# Patient Record
Sex: Male | Born: 1965 | Race: Black or African American | Hispanic: No | Marital: Single | Smoking: Current every day smoker
Health system: Southern US, Community
[De-identification: ages and names within clinical notes are randomized; demographics above are authoritative.]

## PROBLEM LIST (undated history)

## (undated) DIAGNOSIS — I10 Essential (primary) hypertension: Secondary | ICD-10-CM

## (undated) DIAGNOSIS — E119 Type 2 diabetes mellitus without complications: Secondary | ICD-10-CM

---

## 2020-01-08 ENCOUNTER — Other Ambulatory Visit: Payer: Self-pay

## 2020-01-08 ENCOUNTER — Emergency Department: Payer: Self-pay

## 2020-01-08 ENCOUNTER — Emergency Department
Admission: EM | Admit: 2020-01-08 | Discharge: 2020-01-08 | Disposition: A | Discharge status: Home / Self Care | Payer: Self-pay | Attending: Emergency Medicine | Admitting: Emergency Medicine

## 2020-01-08 ENCOUNTER — Encounter: Payer: Self-pay | Admitting: Radiology

## 2020-01-08 DIAGNOSIS — Z20822 Contact with and (suspected) exposure to covid-19: Secondary | ICD-10-CM | POA: Insufficient documentation

## 2020-01-08 DIAGNOSIS — E1165 Type 2 diabetes mellitus with hyperglycemia: Secondary | ICD-10-CM | POA: Insufficient documentation

## 2020-01-08 DIAGNOSIS — L0231 Cutaneous abscess of buttock: Secondary | ICD-10-CM

## 2020-01-08 LAB — CBC WITH DIFFERENTIAL/PLATELET
Abs Immature Granulocytes: 0.02 10*3/uL (ref 0.00–0.07)
Basophils Absolute: 0 10*3/uL (ref 0.0–0.1)
Basophils Relative: 0 %
Eosinophils Absolute: 0.1 10*3/uL (ref 0.0–0.5)
Eosinophils Relative: 1 %
HCT: 35.8 % — ABNORMAL LOW (ref 39.0–52.0)
Hemoglobin: 12.5 g/dL — ABNORMAL LOW (ref 13.0–17.0)
Immature Granulocytes: 0 %
Lymphocytes Relative: 25 %
Lymphs Abs: 1.7 10*3/uL (ref 0.7–4.0)
MCH: 32.1 pg (ref 26.0–34.0)
MCHC: 34.9 g/dL (ref 30.0–36.0)
MCV: 92 fL (ref 80.0–100.0)
Monocytes Absolute: 0.4 10*3/uL (ref 0.1–1.0)
Monocytes Relative: 6 %
Neutro Abs: 4.5 10*3/uL (ref 1.7–7.7)
Neutrophils Relative %: 68 %
Platelets: 223 10*3/uL (ref 150–400)
RBC: 3.89 MIL/uL — ABNORMAL LOW (ref 4.22–5.81)
RDW: 12.2 % (ref 11.5–15.5)
WBC: 6.7 10*3/uL (ref 4.0–10.5)
nRBC: 0 % (ref 0.0–0.2)

## 2020-01-08 LAB — BASIC METABOLIC PANEL
Anion gap: 12 (ref 5–15)
BUN: 11 mg/dL (ref 6–20)
CO2: 24 mmol/L (ref 22–32)
Calcium: 8.1 mg/dL — ABNORMAL LOW (ref 8.9–10.3)
Chloride: 96 mmol/L — ABNORMAL LOW (ref 98–111)
Creatinine, Ser: 1.06 mg/dL (ref 0.61–1.24)
GFR calc Af Amer: >60 mL/min (ref >60)
GFR calc non Af Amer: >60 mL/min (ref >60)
Glucose, Bld: 479 mg/dL — ABNORMAL HIGH (ref 70–99)
Potassium: 3.9 mmol/L (ref 3.5–5.1)
Sodium: 132 mmol/L — ABNORMAL LOW (ref 135–145)

## 2020-01-08 LAB — SARS CORONAVIRUS 2 BY RT PCR (HOSPITAL ORDER, PERFORMED IN ~~LOC~~ HOSPITAL LAB): SARS Coronavirus 2: NEGATIVE

## 2020-01-08 LAB — LACTIC ACID, PLASMA: Lactic Acid, Venous: 1 mmol/L (ref 0.5–1.9)

## 2020-01-08 LAB — GLUCOSE, CAPILLARY: Glucose-Capillary: 257 mg/dL — ABNORMAL HIGH (ref 70–99)

## 2020-01-08 IMAGING — CT CT ABD-PELV W/ CM
2 of 5 series · 15 of 46 positions shown, 17 images · IV contrast (APPLIED)
Comparison: No priors.

CLINICAL DATA: 53-year-old male with history of palpable
abnormality in the inner aspect of the left buttock region near the
anus concerning for perirectal abscess.

EXAM:
CT ABDOMEN AND PELVIS WITH CONTRAST
TECHNIQUE: Multidetector CT imaging of the abdomen and pelvis was performed
using the standard protocol following bolus administration of
intravenous contrast.
CONTRAST:  125mL OMNIPAQUE IOHEXOL 300 MG/ML  SOLN

[Series 2: routine abd/pel with · axial · 0.93mm/px · z∈[-560,-55]mm · 12 of 113 slices shown, 14 images]
[im 6/113  soft-tissue]
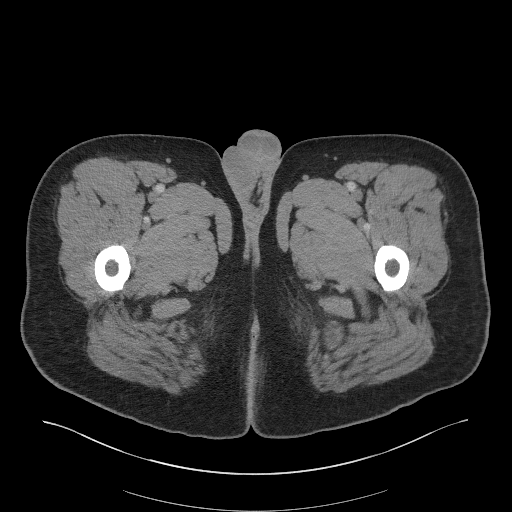
[im 6/113  bone]
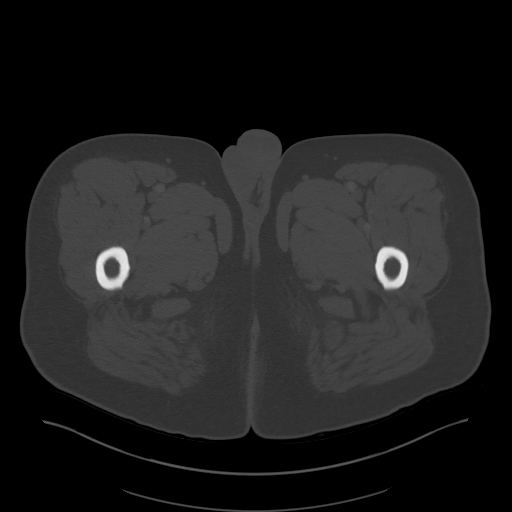
[im 18/113  soft-tissue]
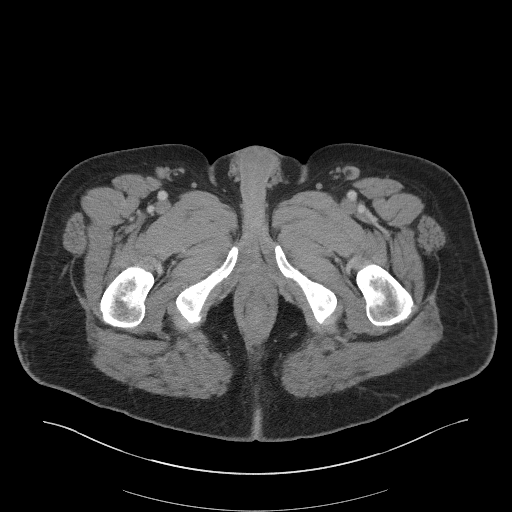
[im 24/113  soft-tissue]
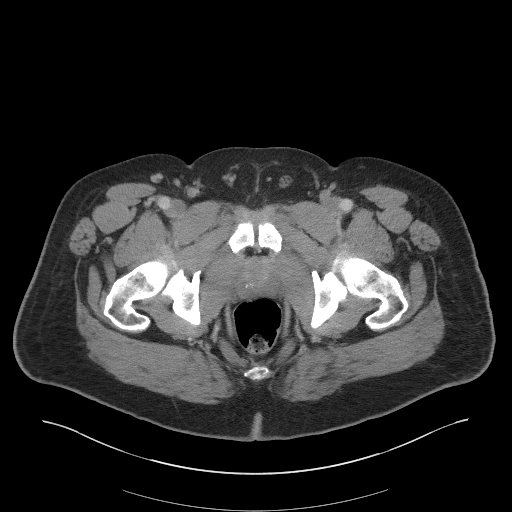
[im 36/113  soft-tissue]
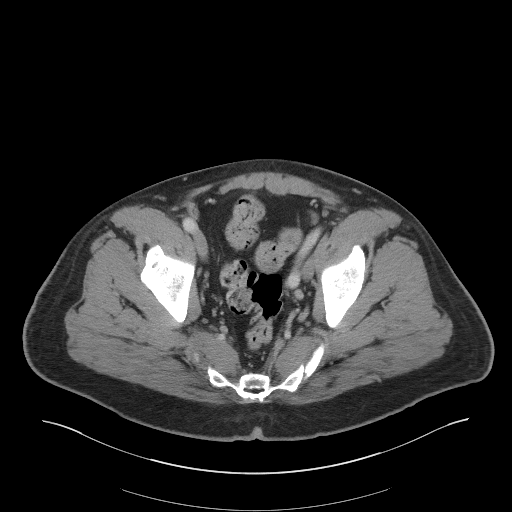
[im 42/113  soft-tissue]
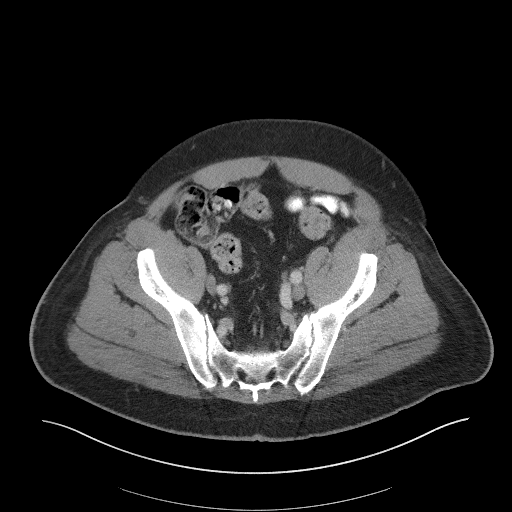
[im 54/113  soft-tissue]
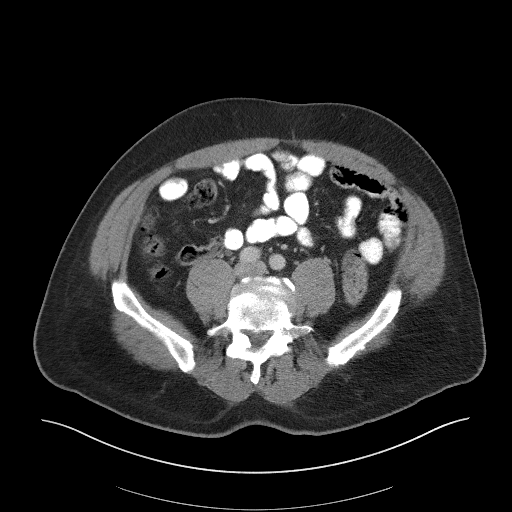
[im 59/113  soft-tissue]
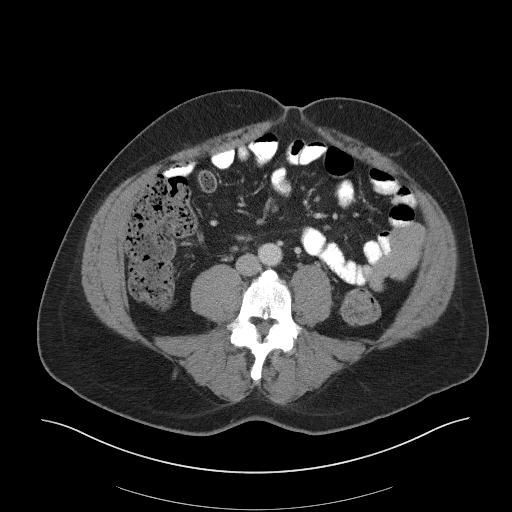
[im 71/113  soft-tissue]
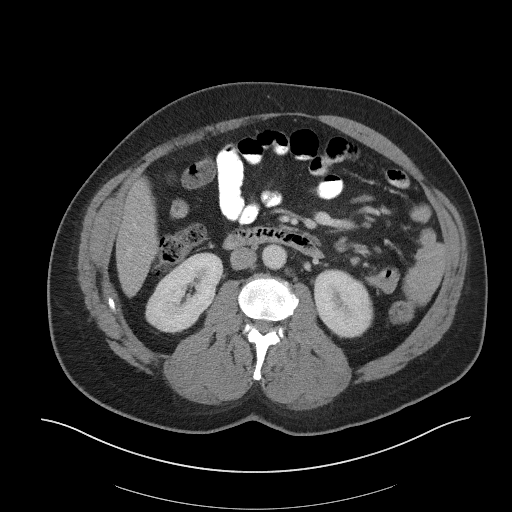
[im 77/113  soft-tissue]
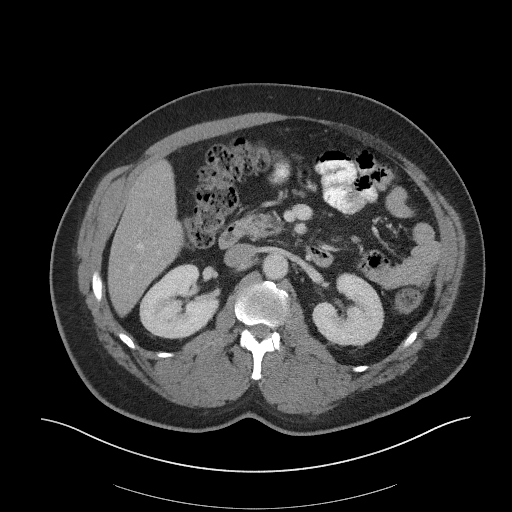
[im 77/113  bone]
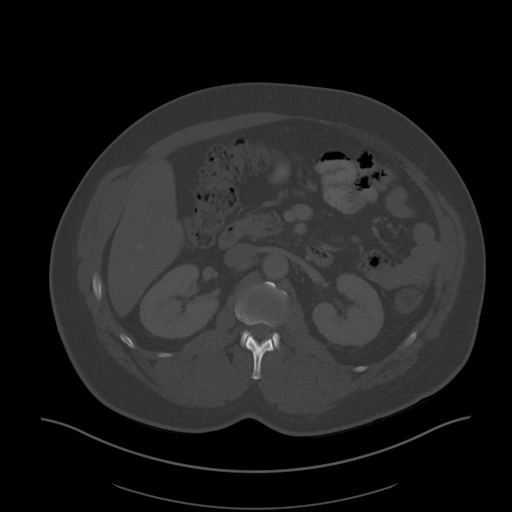
[im 89/113  soft-tissue]
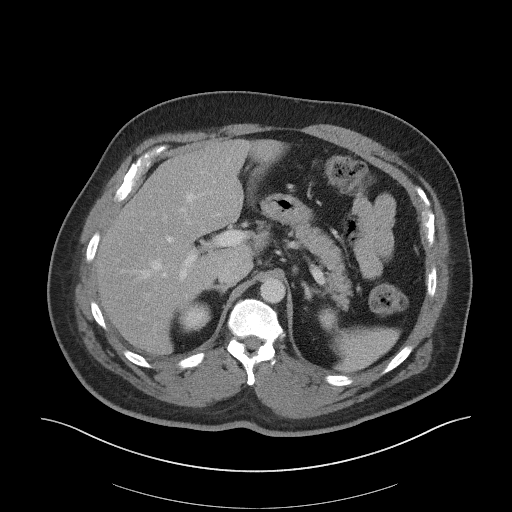
[im 95/113  soft-tissue]
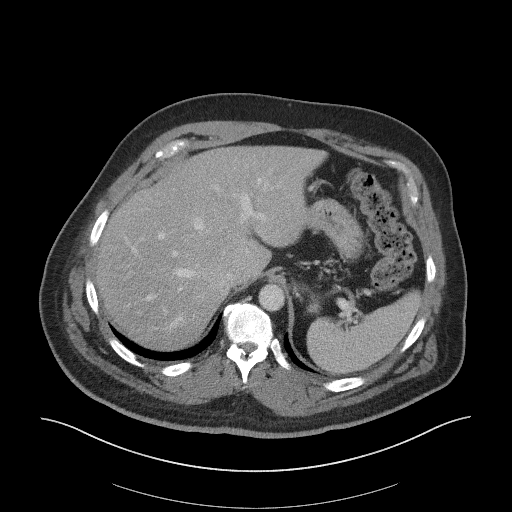
[im 107/113  soft-tissue]
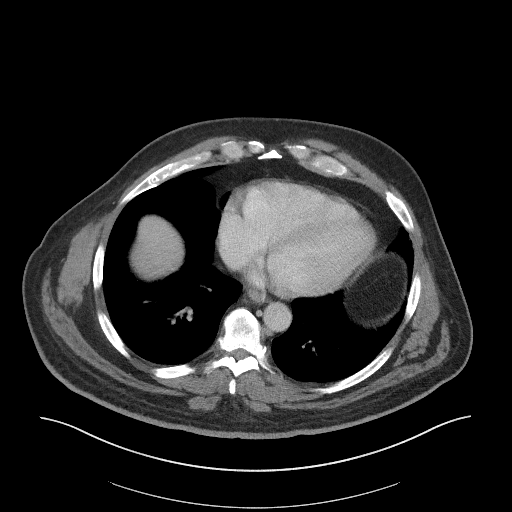

[Series 5: coronal st · coronal · 0.83mm/px · 3 of 109 slices shown]
[im 37/109  soft-tissue]
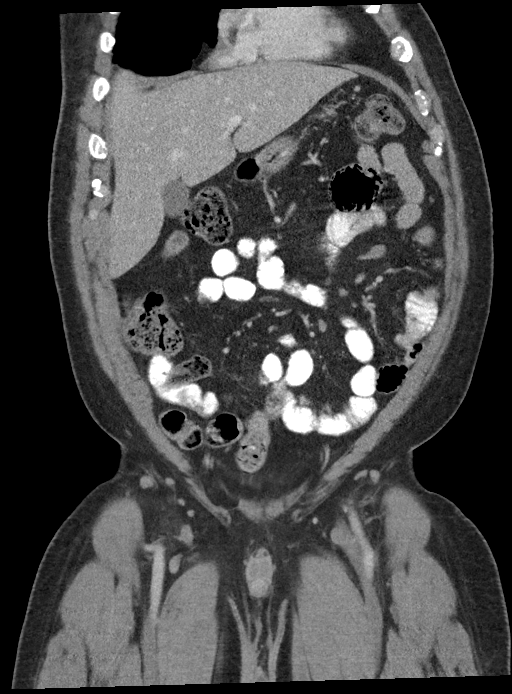
[im 49/109  soft-tissue]
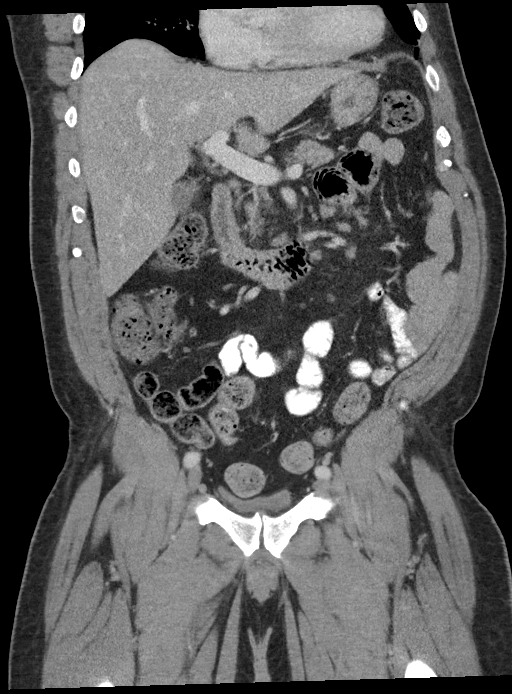
[im 61/109  soft-tissue]
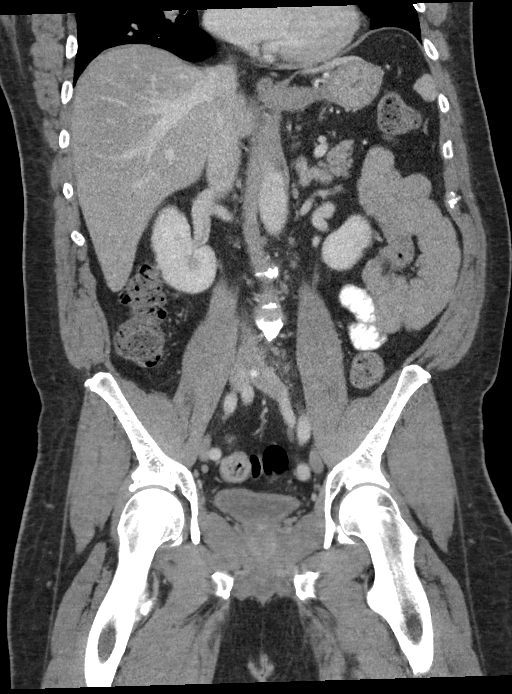

[15 of 46 positions shown; findings below may reference images not displayed]

FINDINGS: Lower chest: Unremarkable.

Hepatobiliary: No suspicious cystic or solid hepatic lesions. No
intra or extrahepatic biliary ductal dilatation. Gallbladder is
normal in appearance.

Pancreas: No pancreatic mass. No pancreatic ductal dilatation. No
pancreatic or peripancreatic fluid collections or inflammatory
changes.

Spleen: Unremarkable.

Adrenals/Urinary Tract: Subcentimeter low-attenuation lesion in the
lower pole the right kidney, too small to characterize, but
statistically likely to represent a cyst. Left kidney and bilateral
adrenal glands are normal in appearance. No hydroureteronephrosis.
Urinary bladder is nearly decompressed, but otherwise unremarkable
in appearance.

Stomach/Bowel: Unenhanced appearance of the stomach is normal. No
pathologic dilatation of small bowel or colon. Normal appendix.

Vascular/Lymphatic: Aortic atherosclerosis, without evidence of
aneurysm or dissection in the abdominal or pelvic vasculature. No
lymphadenopathy noted in the abdomen or pelvis.

Reproductive: Prostate gland and seminal vesicles are unremarkable
in appearance.

Other: No significant volume of ascites.  No pneumoperitoneum.

Musculoskeletal: In the subcutaneous fat of the medial aspect of the
left gluteal cleft there is a small 2.5 x 1.1 x 1.8 cm lesion (axial
image 101 of series 2 and coronal image 99 of series 5) which is
centrally low-attenuation with some peripheral enhancement and
surrounding inflammatory changes in the adjacent fat, compatible
with a small abscess. There are no aggressive appearing lytic or
blastic lesions noted in the visualized portions of the skeleton.
IMPRESSION: 1. Small abscess in the medial aspect of the left gluteal region. No
definitive fistulous connection identified on today's CT
examination. If there is clinical concern for perianal fistula,
follow-up nonemergent pelvic MRI with and without IV gadolinium
could be performed as this is far more sensitive and specific for
evaluation of perianal fistulae.
2. No other acute findings are noted in the abdomen or pelvis.
3. Aortic atherosclerosis.

## 2020-01-08 MED ORDER — IOHEXOL 9 MG/ML PO SOLN: 500.0000 mL | Freq: Once | ORAL | Status: DC | PRN

## 2020-01-08 MED ORDER — CIPROFLOXACIN IN D5W 400 MG/200ML IV SOLN
400.0000 mg | Freq: Once | INTRAVENOUS | Status: AC
  Administered 2020-01-08: 400 mg via INTRAVENOUS
  Filled 2020-01-08: qty 200

## 2020-01-08 MED ORDER — METRONIDAZOLE 500 MG PO TABS
500.0000 mg | ORAL_TABLET | Freq: Two times a day (BID) | ORAL | 0 refills | Status: AC
Start: 2020-01-08 — End: ?

## 2020-01-08 MED ORDER — MORPHINE SULFATE (PF) 4 MG/ML IV SOLN
4.0000 mg | Freq: Once | INTRAVENOUS | Status: AC
  Administered 2020-01-08: 4 mg via INTRAVENOUS
  Filled 2020-01-08: qty 1

## 2020-01-08 MED ORDER — HYDROCODONE-ACETAMINOPHEN 5-325 MG PO TABS: 1.0000 | ORAL_TABLET | Freq: Four times a day (QID) | ORAL | 0 refills | Status: AC | PRN

## 2020-01-08 MED ORDER — IOHEXOL 9 MG/ML PO SOLN
500.0000 mL | ORAL | Status: AC
  Administered 2020-01-08 (×2): 500 mL via ORAL

## 2020-01-08 MED ORDER — SODIUM CHLORIDE 0.9 % IV BOLUS
1000.0000 mL | Freq: Once | INTRAVENOUS | Status: AC
  Administered 2020-01-08: 1000 mL via INTRAVENOUS

## 2020-01-08 MED ORDER — IOHEXOL 300 MG/ML  SOLN
125.0000 mL | Freq: Once | INTRAMUSCULAR | Status: AC | PRN
  Administered 2020-01-08: 125 mL via INTRAVENOUS

## 2020-01-08 MED ORDER — ONDANSETRON HCL 4 MG/2ML IJ SOLN
4.0000 mg | Freq: Once | INTRAMUSCULAR | Status: AC
  Administered 2020-01-08: 4 mg via INTRAVENOUS
  Filled 2020-01-08: qty 2

## 2020-01-08 MED ORDER — INSULIN ASPART 100 UNIT/ML ~~LOC~~ SOLN
10.0000 [IU] | Freq: Once | SUBCUTANEOUS | Status: AC
  Administered 2020-01-08: 10 [IU] via SUBCUTANEOUS
  Filled 2020-01-08: qty 1

## 2020-01-08 MED ORDER — METRONIDAZOLE IN NACL 5-0.79 MG/ML-% IV SOLN
500.0000 mg | Freq: Once | INTRAVENOUS | Status: AC
  Administered 2020-01-08: 500 mg via INTRAVENOUS
  Filled 2020-01-08: qty 100

## 2020-01-08 MED ORDER — CIPROFLOXACIN HCL 500 MG PO TABS
500.0000 mg | ORAL_TABLET | Freq: Two times a day (BID) | ORAL | 0 refills | Status: AC
Start: 2020-01-08 — End: 2020-01-22

## 2020-01-08 NOTE — Consult Note (Signed)
Patient ID: Lawrence Walker, male   DOB: May 12, 1966, 54 y.o.   MRN: [id number]  HPI Lawrence Walker is a 54 y.o. male seen in consultation at the request of Dr. Fanny Bien (Case discussed with him in detail.). He reports that he has 1 week of pain in the perirectal area towards the midline. No fevers no chills. The pain is intermittent sharp worsening when he sits and moderate intensity. Currently does not take any medications but he is obviously a diabetic. Blood sugar on arrival was 479. Rest of the labs were unremarkable. He reports no previous episode like this. No prior colonoscopies. CT scan personally reviewed showing evidence of a small indurated area on the left gluteal region. I do not see that the finding collection that is amenable for drainage.   HPI  Past medical history: Diabetes  No pertinent family history Social History Social History   Tobacco Use  . Smoking status: Not on file  Substance Use Topics  . Alcohol use: Not on file  . Drug use: Not on file    No Known Allergies  No current facility-administered medications for this encounter.   Current Outpatient Medications  Medication Sig Dispense Refill  . ciprofloxacin (CIPRO) 500 MG tablet Take 1 tablet (500 mg total) by mouth 2 (two) times daily for 14 days. 28 tablet 0  . HYDROcodone-acetaminophen (NORCO/VICODIN) 5-325 MG tablet Take 1-2 tablets by mouth every 6 (six) hours as needed for moderate pain. 14 tablet 0  . metroNIDAZOLE (FLAGYL) 500 MG tablet Take 1 tablet (500 mg total) by mouth 2 (two) times daily. 28 tablet 0     Review of Systems Full ROS  was asked and was negative except for the information on the HPI  Physical Exam Blood pressure 122/88, pulse 78, temperature 98.2 F (36.8 C), temperature source Oral, resp. rate 19, height 6\' 1"  (1.854 m), weight 120.2 kg, SpO2 99 %. CONSTITUTIONAL: NAD EYES: Pupils are equal, round, and reactive to light, Sclera are non-icteric. EARS, NOSE, MOUTH AND THROAT:  The oropharynx is clear. The oral mucosa is pink and moist. Hearing is intact to voice. LYMPH NODES:  Lymph nodes in the neck are normal. RESPIRATORY:  Lungs are clear. There is normal respiratory effort, with equal breath sounds bilaterally, and without pathologic use of accessory muscles. CARDIOVASCULAR: Heart is regular without murmurs, gallops, or rubs. GI: The abdomen is  soft, nontender, and nondistended. There are no palpable masses. There is no hepatosplenomegaly. There are normal bowel sounds in all quadrants. Rectal: There is evidence of midline cleft tenderness about 3 cm from the anal verge. There is no evidence of fluctuance there is minimal induration. There is no evidence of ulceration or open wounds. There is no evidence of defined abscess. MUSCULOSKELETAL: Normal muscle strength and tone. No cyanosis or edema.   SKIN: Turgor is good and there are no pathologic skin lesions or ulcers. NEUROLOGIC: Motor and sensation is grossly normal. Cranial nerves are grossly intact. PSYCH:  Oriented to person, place and time. Affect is normal.  Data Reviewed  I have personally reviewed the patient's imaging, laboratory findings and medical records.    Assessment/Plan   54 year old male with perirectal infection/early abscess that is very small. Currently not ready for formal I&D as there is no fluctuance. CT scan shows a tiny indurated portion. Given the findings I will recommend antibiotic therapy, good glucose control and I will be happy to see him in a few days. If he were to deteriorate he  may require formal exam under anesthesia and formal incision and drainage. Discussed with patient in detail. He understands.  Sterling Big, MD FACS General Surgeon 01/08/2020, 12:47 PM

## 2020-01-08 NOTE — ED Provider Notes (Signed)
Patient seen by general surgery, advising against surgical intervention at this time but rather treat meant with 2 weeks of Cipro and Flagyl as recommended by Dr. Everlene Farrier.  Patient will follow up with him on Wednesday this week as well  Patient understanding and agreeable with plan.  Alert and ambulatory.  I will prescribe the patient a narcotic pain medicine due to their condition which I anticipate will cause at least moderate pain short term. I discussed with the patient safe use of narcotic pain medicines, and that they are not to drive, work in dangerous areas, or ever take more than prescribed (no more than 1 to 2 pills every 6 hours). We discussed that this is the type of medication that can be  overdosed on and the risks of this type of medicine. Patient is very agreeable to only use as prescribed and to never use more than prescribed.  Return precautions and treatment recommendations and follow-up discussed with the patient who is agreeable with the plan.      Sharyn Creamer, MD 01/08/20 1041

## 2020-01-08 NOTE — ED Provider Notes (Signed)
[facility] Emergency Department Provider Note   ____________________________________________   First MD Initiated Contact with Patient 01/08/20 [phone]     (approximate)  I have reviewed the triage vital signs and the nursing notes.   HISTORY  Chief Complaint Abscess    HPI Lawrence Walker is a 54 y.o. male who presents to the ED from home with a chief complaint of "boil to my tailbone".  Onset 1 week ago.  Patient states the area does not feel larger nor has it drained.  Denies associated fever, chills, cough, chest pain, shortness of breath, abdominal pain, nausea, vomiting or dizziness.  Denies recent travel or trauma.      Past medical history Diabetes  There are no problems to display for this patient.   Prior to Admission medications   Not on File    Allergies Patient has no known allergies.  No family history on file.  Social History Social History   Tobacco Use   Smoking status: Not on file  Substance Use Topics   Alcohol use: Not on file   Drug use: Not on file    Review of Systems  Constitutional: No fever/chills Eyes: No visual changes. ENT: No sore throat. Cardiovascular: Denies chest pain. Respiratory: Denies shortness of breath. Gastrointestinal: No abdominal pain.  No nausea, no vomiting.  No diarrhea.  No constipation. Genitourinary: Negative for dysuria. Musculoskeletal: Negative for back pain. Skin: Positive for boil on tailbone.  Negative for rash. Neurological: Negative for headaches, focal weakness or numbness.   ____________________________________________   PHYSICAL EXAM:  VITAL SIGNS: ED Triage Vitals [01/08/20 0333]  Enc Vitals Group     BP (!) 134/95     Pulse Rate 85     Resp 17     Temp 98.2 F (36.8 C)     Temp Source Oral     SpO2 98 %     Weight 265 lb (120.2 kg)     Height 6\' 1"  (1.854 m)     Head Circumference      Peak Flow      Pain Score      Pain Loc      Pain Edu?       Excl. in GC?     Constitutional: Alert and oriented. Well appearing and in no acute distress. Eyes: Conjunctivae are normal. PERRL. EOMI. Head: Atraumatic. Nose: No congestion/rhinnorhea. Mouth/Throat: Mucous membranes are moist.   Neck: No stridor.   Cardiovascular: Normal rate, regular rhythm. Grossly normal heart sounds.  Good peripheral circulation. Respiratory: Normal respiratory effort.  No retractions. Lungs CTAB. Gastrointestinal: Soft and nontender to light or deep palpation. No distention. No abdominal bruits. No CVA tenderness. Rectal: External exam reveals approximately 3 x 2 cm area of fullness to left gluteal cleft without skin changes of warmth or erythema.  DRE uncomfortable but not excruciating.  Small area of induration palpated to the left. Musculoskeletal: No lower extremity tenderness nor edema.  No joint effusions. Neurologic:  Normal speech and language. No gross focal neurologic deficits are appreciated. No gait instability. Skin:  Skin is warm, dry and intact. No rash noted. Psychiatric: Mood and affect are normal. Speech and behavior are normal.  ____________________________________________   LABS (all labs ordered are listed, but only abnormal results are displayed)  Labs Reviewed  CBC WITH DIFFERENTIAL/PLATELET - Abnormal; Notable for the following components:      Result Value   RBC 3.89 (*)    Hemoglobin 12.5 (*)  HCT 35.8 (*)    All other components within normal limits  BASIC METABOLIC PANEL - Abnormal; Notable for the following components:   Sodium 132 (*)    Chloride 96 (*)    Glucose, Bld 479 (*)    Calcium 8.1 (*)    All other components within normal limits  LACTIC ACID, PLASMA  LACTIC ACID, PLASMA   ____________________________________________  EKG  None ____________________________________________  RADIOLOGY  ED MD interpretation: Pending  Official radiology report(s): No results  found.  ____________________________________________   PROCEDURES  Procedure(s) performed (including Critical Care):  Procedures   ____________________________________________   INITIAL IMPRESSION / ASSESSMENT AND PLAN / ED COURSE  As part of my medical decision making, I reviewed the following data within the electronic MEDICAL RECORD NUMBER Nursing notes reviewed and incorporated, Labs reviewed, Old chart reviewed, Radiograph reviewed, Notes from prior ED visits and [place] Controlled Substance Database     Lawrence Walker was evaluated in Emergency Department on 01/08/2020 for the symptoms described in the history of present illness. He was evaluated in the context of the global COVID-19 pandemic, which necessitated consideration that the patient might be at risk for infection with the SARS-CoV-2 virus that causes COVID-19. Institutional protocols and algorithms that pertain to the evaluation of patients at risk for COVID-19 are in a state of rapid change based on information released by regulatory bodies including the CDC and federal and state organizations. These policies and algorithms were followed during the patient's care in the ED.    54 year old male presenting with painful and swollen area to his buttock.  Differential diagnosis includes but is not limited to cutaneous abscess, hemorrhoid, cellulitis, perirectal abscess, etc.  Clinically this does not appear to be a cutaneous skin abscess.  Will obtain basic lab work and CT scanning to evaluate perirectal abscess.  Will administer IV morphine for pain and reassess.   Clinical Course as of Jan 08 652  Sat Jan 08, 2020  4627 Patient drinking contrast in preparation for CT scan.  Updated him on laboratory results.  Care will be transferred to the oncoming provider at shift change.  Disposition pending results of CT scan.   [JS]    Clinical Course User Index [JS] Irean Hong, MD      ____________________________________________   FINAL CLINICAL IMPRESSION(S) / ED DIAGNOSES  Final diagnoses:  Hyperglycemia     ED Discharge Orders    None       Note:  This document was prepared using Dragon voice recognition software and may include unintentional dictation errors.   Irean Hong, MD 01/08/20 [phone]

## 2020-01-08 NOTE — ED Notes (Signed)
Dr. Pabon at bedside.  

## 2020-01-08 NOTE — ED Notes (Signed)
Pt reports last week he started noticing a bump in his left inner buttocks, near the anus. Pt reports as of last night, the pain became unbearable

## 2020-01-08 NOTE — ED Triage Notes (Signed)
Patient reports boil on his tail bone.  Noticed several day ago.

## 2020-01-08 NOTE — ED Provider Notes (Signed)
Vitals:   01/08/20 0530 01/08/20 0659  BP: 140/86 (!) 140/96  Pulse: 74 77  Resp:  16  Temp:    SpO2: 99% 100%     CT Abdomen Pelvis W Contrast  Result Date: 01/08/2020 CLINICAL DATA:  54 year old male with history of palpable abnormality in the inner aspect of the left buttock region near the anus concerning for perirectal abscess. EXAM: CT ABDOMEN AND PELVIS WITH CONTRAST TECHNIQUE: Multidetector CT imaging of the abdomen and pelvis was performed using the standard protocol following bolus administration of intravenous contrast. CONTRAST:  OMNIPAQUE IOHEXOL 300 MG/ML  SOLN COMPARISON:  No priors. FINDINGS: Lower chest: Unremarkable. Hepatobiliary: No suspicious cystic or solid hepatic lesions. No intra or extrahepatic biliary ductal dilatation. Gallbladder is normal in appearance. Pancreas: No pancreatic mass. No pancreatic ductal dilatation. No pancreatic or peripancreatic fluid collections or inflammatory changes. Spleen: Unremarkable. Adrenals/Urinary Tract: Subcentimeter low-attenuation lesion in the lower pole the right kidney, too small to characterize, but statistically likely to represent a cyst. Left kidney and bilateral adrenal glands are normal in appearance. No hydroureteronephrosis. Urinary bladder is nearly decompressed, but otherwise unremarkable in appearance. Stomach/Bowel: Unenhanced appearance of the stomach is normal. No pathologic dilatation of small bowel or colon. Normal appendix. Vascular/Lymphatic: Aortic atherosclerosis, without evidence of aneurysm or dissection in the abdominal or pelvic vasculature. No lymphadenopathy noted in the abdomen or pelvis. Reproductive: Prostate gland and seminal vesicles are unremarkable in appearance. Other: No significant volume of ascites.  No pneumoperitoneum. Musculoskeletal: In the subcutaneous fat of the medial aspect of the left gluteal cleft there is a small 2.5 x 1.1 x 1.8 cm lesion (axial image 101 of series 2 and coronal image 99  of series 5) which is centrally low-attenuation with some peripheral enhancement and surrounding inflammatory changes in the adjacent fat, compatible with a small abscess. There are no aggressive appearing lytic or blastic lesions noted in the visualized portions of the skeleton. IMPRESSION: 1. Small abscess in the medial aspect of the left gluteal region. No definitive fistulous connection identified on today's CT examination. If there is clinical concern for perianal fistula, follow-up nonemergent pelvic MRI with and without IV gadolinium could be performed as this is far more sensitive and specific for evaluation of perianal fistulae. 2. No other acute findings are noted in the abdomen or pelvis. 3. Aortic atherosclerosis. Electronically Signed   By: Trudie Reed M.D.   On: 01/08/2020 08:40    Imaging reviewed, concerning for small abscess left gluteal region.  I personally examined the patient, he does have an area of firmness along the left gluteal cleft, it is tender consistent with probable abscess.  He also reports fairly significant tenderness on rectal examination.   Discussed with general surgery, Dr. Everlene Farrier.  He requests we start the patient on antibiotic, will see in consult in the event the patient may require surgical evaluation.  Patient resting comfortably at this time.  Has received insulin, repeat glucose ordered.  Patient fully awake and alert.   Sharyn Creamer, MD 01/08/20 [phone]

## 2020-01-08 NOTE — ED Notes (Signed)
Pt transported for CT 

## 2021-01-10 ENCOUNTER — Other Ambulatory Visit: Payer: Self-pay

## 2021-01-10 ENCOUNTER — Emergency Department
Admission: EM | Admit: 2021-01-10 | Discharge: 2021-01-10 | Disposition: A | Discharge status: Home / Self Care | Payer: Self-pay | Attending: Emergency Medicine | Admitting: Emergency Medicine

## 2021-01-10 ENCOUNTER — Encounter: Payer: Self-pay | Admitting: Emergency Medicine

## 2021-01-10 DIAGNOSIS — L02214 Cutaneous abscess of groin: Secondary | ICD-10-CM | POA: Insufficient documentation

## 2021-01-10 DIAGNOSIS — Z5321 Procedure and treatment not carried out due to patient leaving prior to being seen by health care provider: Secondary | ICD-10-CM | POA: Insufficient documentation

## 2021-01-10 HISTORY — DX: Type 2 diabetes mellitus without complications: E11.9

## 2021-01-10 HISTORY — DX: Essential (primary) hypertension: I10

## 2021-01-10 LAB — BASIC METABOLIC PANEL
Anion gap: 15 (ref 5–15)
BUN: 22 mg/dL — ABNORMAL HIGH (ref 6–20)
CO2: 20 mmol/L — ABNORMAL LOW (ref 22–32)
Calcium: 8.7 mg/dL — ABNORMAL LOW (ref 8.9–10.3)
Chloride: 89 mmol/L — ABNORMAL LOW (ref 98–111)
Creatinine, Ser: 1.22 mg/dL (ref 0.61–1.24)
GFR, Estimated: >60 mL/min (ref >60)
Glucose, Bld: 458 mg/dL — ABNORMAL HIGH (ref 70–99)
Potassium: 4.3 mmol/L (ref 3.5–5.1)
Sodium: 124 mmol/L — ABNORMAL LOW (ref 135–145)

## 2021-01-10 LAB — URINALYSIS, COMPLETE (UACMP) WITH MICROSCOPIC
Bacteria, UA: NONE SEEN
Bilirubin Urine: NEGATIVE
Glucose, UA: >=500 mg/dL — AB
Hgb urine dipstick: NEGATIVE
Ketones, ur: 80 mg/dL — AB
Leukocytes,Ua: NEGATIVE
Nitrite: NEGATIVE
Protein, ur: NEGATIVE mg/dL
Specific Gravity, Urine: 1.025 (ref 1.005–1.030)
pH: 5 (ref 5.0–8.0)

## 2021-01-10 LAB — CBC
HCT: 39 % (ref 39.0–52.0)
Hemoglobin: 14 g/dL (ref 13.0–17.0)
MCH: 32.1 pg (ref 26.0–34.0)
MCHC: 35.9 g/dL (ref 30.0–36.0)
MCV: 89.4 fL (ref 80.0–100.0)
Platelets: 267 10*3/uL (ref 150–400)
RBC: 4.36 MIL/uL (ref 4.22–5.81)
RDW: 11.6 % (ref 11.5–15.5)
WBC: 14.4 10*3/uL — ABNORMAL HIGH (ref 4.0–10.5)
nRBC: 0 % (ref 0.0–0.2)

## 2021-01-10 LAB — CBG MONITORING, ED
Glucose-Capillary: 424 mg/dL — ABNORMAL HIGH (ref 70–99)
Glucose-Capillary: 388 mg/dL — ABNORMAL HIGH (ref 70–99)

## 2021-01-10 MED ORDER — SODIUM CHLORIDE 0.9 % IV BOLUS
1000.0000 mL | Freq: Once | INTRAVENOUS | Status: AC
  Administered 2021-01-10: 1000 mL via INTRAVENOUS

## 2021-01-10 NOTE — ED Notes (Addendum)
Pt calling out, angry because he has not been offered food; pt was given a sandwich tray and soda just recently; pt also angry over wait time and that "he does not have a TV" (pt was actually watching TV in a recliner in subwait, with remote in his hand); pt updated on wait time and demands IV out stating he is going to leave and come back; explained to pt that he would have to re-register if doing so; 18GA SL removed from rt a/c--cannula intact, dsng applied; pt then exits subwait and walks to lobby, purchases snacks from the vending machine then sits back down in lobby and st he is staying

## 2021-01-10 NOTE — ED Triage Notes (Signed)
Pt comes into the ED via EMS [place] stop c/o hyperglycemia and an abscess left thigh.  PT c/o  N/V for the past 2 days.  CBG 484, temp 99.5, 18g R AC, 200cc fluid given.  Pt in NAD with even and unlabored respirations.

## 2021-01-10 NOTE — ED Notes (Signed)
Pt asking to have IV removed, pt upset about wait times, advised him there were still several people in front of him. Pt making comments about going somewhere else. Pt then got up and walked out of triage room2 and went back to subwait.  IV left in place.

## 2021-01-10 NOTE — ED Notes (Signed)
Pt. Requested for a sandwich tray and was given a sandwich tray and a soda to drink.

## 2021-01-10 NOTE — ED Triage Notes (Signed)
Pt comes into the ED via EMS from [employer] truck stop, pt is out of town [employer], pt c/o N/V for the past 2 days Pt has abscess to the left groin area [id number] Temp 99.5 #[id number], NS
